# Patient Record
Sex: Male | Born: 1995 | Race: White | Hispanic: No | Marital: Single | Smoking: Never smoker
Health system: Southern US, Community
[De-identification: ages and names within clinical notes are randomized; demographics above are authoritative.]

## PROBLEM LIST (undated history)

## (undated) DIAGNOSIS — F419 Anxiety disorder, unspecified: Secondary | ICD-10-CM

## (undated) HISTORY — DX: Anxiety disorder, unspecified: F41.9

---
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
## 2013-09-20 ENCOUNTER — Encounter (HOSPITAL_COMMUNITY): Payer: Self-pay | Admitting: Emergency Medicine

## 2013-09-20 ENCOUNTER — Emergency Department (HOSPITAL_COMMUNITY): Payer: Worker's Compensation

## 2013-09-20 ENCOUNTER — Emergency Department (HOSPITAL_COMMUNITY)
Admission: EM | Admit: 2013-09-20 | Discharge: 2013-09-20 | Disposition: A | Discharge status: Home / Self Care | Payer: Worker's Compensation | Attending: Emergency Medicine | Admitting: Emergency Medicine

## 2013-09-20 DIAGNOSIS — S060X9A Concussion with loss of consciousness of unspecified duration, initial encounter: Secondary | ICD-10-CM | POA: Insufficient documentation

## 2013-09-20 DIAGNOSIS — Y92410 Unspecified street and highway as the place of occurrence of the external cause: Secondary | ICD-10-CM | POA: Diagnosis not present

## 2013-09-20 DIAGNOSIS — Z792 Long term (current) use of antibiotics: Secondary | ICD-10-CM | POA: Insufficient documentation

## 2013-09-20 DIAGNOSIS — S20219A Contusion of unspecified front wall of thorax, initial encounter: Secondary | ICD-10-CM | POA: Diagnosis not present

## 2013-09-20 DIAGNOSIS — S069X9A Unspecified intracranial injury with loss of consciousness of unspecified duration, initial encounter: Secondary | ICD-10-CM

## 2013-09-20 DIAGNOSIS — Y9389 Activity, other specified: Secondary | ICD-10-CM | POA: Insufficient documentation

## 2013-09-20 MED ORDER — IBUPROFEN 800 MG PO TABS: 800.0000 mg | ORAL_TABLET | Freq: Three times a day (TID) | ORAL | Status: DC | PRN

## 2013-09-20 MED ORDER — HYDROCODONE-ACETAMINOPHEN 5-325 MG PO TABS
1.0000 | ORAL_TABLET | Freq: Once | ORAL | Status: AC
  Administered 2013-09-20: 1 via ORAL
  Filled 2013-09-20: qty 1

## 2013-09-20 MED ORDER — IBUPROFEN 800 MG PO TABS
800.0000 mg | ORAL_TABLET | Freq: Once | ORAL | Status: AC
  Administered 2013-09-20: 800 mg via ORAL
  Filled 2013-09-20: qty 1

## 2013-09-20 MED ORDER — HYDROCODONE-ACETAMINOPHEN 5-325 MG PO TABS: 1.0000 | ORAL_TABLET | Freq: Four times a day (QID) | ORAL | Status: DC | PRN

## 2013-09-20 NOTE — ED Notes (Signed)
Patient transported to CT 
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
## 2013-09-20 NOTE — Discharge Instructions (Signed)
Chest Contusion A contusion is a deep bruise. Bruises happen when an injury causes bleeding under the skin. Signs of bruising include pain, puffiness (swelling), and discolored skin. The bruise may turn blue, purple, or yellow.  HOME CARE  Put ice on the injured area.  Put ice in a plastic bag.  Place a towel between the skin and the bag.  Leave the ice on for 15-20 minutes at a time, 03-04 times a day for the first 48 hours.  Only take medicine as told by your doctor.  Rest.  Take deep breaths (deep-breathing exercises) as told by your doctor.  Stop smoking if you smoke.  Do not lift objects over 5 pounds (2.3 kilograms) for 3 days or longer if told by your doctor. GET HELP RIGHT AWAY IF:   You have more bruising or puffiness.  You have pain that gets worse.  You have trouble breathing.  You are dizzy, weak, or pass out (faint).  You have blood in your pee (urine) or poop (stool).  You cough up or throw up (vomit) blood.  Your puffiness or pain is not helped with medicines. MAKE SURE YOU:   Understand these instructions.  Will watch your condition.  Will get help right away if you are not doing well or get worse. Document Released: 01/15/2008 Document Revised: 04/22/2012 Document Reviewed: 01/20/2012 [facility] Patient Information 2014 [place], [place]. Today, your chest x-ray, head CT are normal, not revealing any fractured ribs, pulmonary contusion, or intracranial bleeding.  You have  been given precautions for minor head injury.  He can safely take the medications prescribed.  If he developed new or worsening symptoms.  Please return immediately for further evaluation
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
## 2013-09-21 NOTE — ED Provider Notes (Signed)
Medical screening examination/treatment/procedure(s) were performed by non-physician practitioner and as supervising physician I was immediately available for consultation/collaboration.  EKG Interpretation   None         Jancarlos Thrun S Jarrah Seher, MD 09/21/13 1313 

## 2014-06-24 ENCOUNTER — Ambulatory Visit (INDEPENDENT_AMBULATORY_CARE_PROVIDER_SITE_OTHER): Payer: BC Managed Care – PPO | Admitting: Physician Assistant

## 2014-06-24 VITALS — BP 120/74 | HR 71 | Temp 98.3°F | Resp 16 | Ht 74.5 in | Wt 166.0 lb

## 2014-06-24 DIAGNOSIS — F419 Anxiety disorder, unspecified: Secondary | ICD-10-CM

## 2014-06-24 MED ORDER — ESCITALOPRAM OXALATE 10 MG PO TABS: 10.0000 mg | ORAL_TABLET | Freq: Every day | ORAL | Status: AC

## 2014-06-24 NOTE — Progress Notes (Signed)
   Subjective:    Patient ID: Johnnette LitterWilliam Vanoverbeke, male    DOB: 04/08/1996, 18 y.o.   MRN: [id number]  HPI Patient presents to start long acting anxiety medication after seeing psychologist, Ulice BoldCarson Sarvis, today 06/24/14. Went to emergency room for anxiety 06/22/14 after not having sx in over 2 years. After returning from break at work, he started feeling like his heart was racing, having trouble breathing, and the space around him was moving fast. Says he had panic attacks when he was in high school, but parents never took him for treatment so he has never taken meds for anxiety. He has an increased stress load now that he is working, paying bills, and going to school for aviation. Unable to see Pleas KochClayton Shugart, PA-C until 07/22/14, so psychologist sent here to start medication in the meantime. ER gave .25 Ativan while there and prescribed 0.5 mg #15pills prn. Denies side effects while on this medication.  Concerned about taking two medications for anxiety and wondering if both are needed.  Review of Systems  Constitutional: Negative for fever, activity change, appetite change, fatigue and unexpected weight change.  Respiratory: Negative for shortness of breath.   Cardiovascular: Negative for chest pain and palpitations.  Neurological: Negative for dizziness, weakness, light-headedness, numbness and headaches.  Psychiatric/Behavioral: Negative for suicidal ideas, behavioral problems, sleep disturbance, self-injury, dysphoric mood, decreased concentration and agitation. The patient is nervous/anxious (not presently). The patient is not hyperactive.        Objective:   Physical Exam  Constitutional: He is oriented to person, place, and time. He appears well-developed and well-nourished. No distress.  Blood pressure 120/74, pulse 71, temperature 98.3 F (36.8 C), resp. rate 16, height 6' 2.5" (1.892 m), weight 166 lb (75.297 kg), SpO2 99 %.   HENT:  Head: Normocephalic and atraumatic.  Right Ear:  External ear normal.  Left Ear: External ear normal.  Eyes: Conjunctivae are normal. Pupils are equal, round, and reactive to light. Right eye exhibits no discharge. Left eye exhibits no discharge. No scleral icterus.  Cardiovascular: Normal rate, regular rhythm and normal heart sounds.  Exam reveals no gallop and no friction rub.   No murmur heard. Pulmonary/Chest: Effort normal and breath sounds normal. He has no wheezes. He has no rales.  Neurological: He is alert and oriented to person, place, and time.  Skin: He is not diaphoretic.  Psychiatric: He has a normal mood and affect. His speech is normal and behavior is normal. Judgment and thought content normal. Cognition and memory are normal.       Assessment & Plan:  1. Anxiety disorder, unspecified anxiety disorder type Had expensive discussion about side effect profile of Lexapro and the need to take medication for 4-6 weeks. Will allow Shugart, PA-C make any adjustments to medication on 07/2014 appt.  - escitalopram (LEXAPRO) 10 MG tablet; Take 1 tablet (10 mg total) by mouth daily.  Dispense: 30 tablet; Refill: 0 - RTC or call if sx worsen or have any of the serious side effects discussed such as SI, HI, depression, sx of serotonin syndrome. - Ativan to be used as prn medication.   Janan Ridgeishira Jayah Balthazar PA-C  [facility] 06/24/2014 6:39 PM

## 2014-06-24 NOTE — Patient Instructions (Signed)
Generalized Anxiety Disorder Generalized anxiety disorder (GAD) is a mental disorder. It interferes with life functions, including relationships, work, and school. GAD is different from normal anxiety, which everyone experiences at some point in their lives in response to specific life events and activities. Normal anxiety actually helps us prepare for and get through these life events and activities. Normal anxiety goes away after the event or activity is over.  GAD causes anxiety that is not necessarily related to specific events or activities. It also causes excess anxiety in proportion to specific events or activities. The anxiety associated with GAD is also difficult to control. GAD can vary from mild to severe. People with severe GAD can have intense waves of anxiety with physical symptoms (panic attacks).  SYMPTOMS The anxiety and worry associated with GAD are difficult to control. This anxiety and worry are related to many life events and activities and also occur more days than not for 6 months or longer. People with GAD also have three or more of the following symptoms (one or more in children):  Restlessness.   Fatigue.  Difficulty concentrating.   Irritability.  Muscle tension.  Difficulty sleeping or unsatisfying sleep. DIAGNOSIS GAD is diagnosed through an assessment by your health care provider. Your health care provider will ask you questions aboutyour mood,physical symptoms, and events in your life. Your health care provider may ask you about your medical history and use of alcohol or drugs, including prescription medicines. Your health care provider may also do a physical exam and blood tests. Certain medical conditions and the use of certain substances can cause symptoms similar to those associated with GAD. Your health care provider may refer you to a mental health specialist for further evaluation. TREATMENT The following therapies are usually used to treat GAD:    Medication. Antidepressant medication usually is prescribed for long-term daily control. Antianxiety medicines may be added in severe cases, especially when panic attacks occur.   Talk therapy (psychotherapy). Certain types of talk therapy can be helpful in treating GAD by providing support, education, and guidance. A form of talk therapy called cognitive behavioral therapy can teach you healthy ways to think about and react to daily life events and activities.  Stress managementtechniques. These include yoga, meditation, and exercise and can be very helpful when they are practiced regularly. A mental health specialist can help determine which treatment is best for you. Some people see improvement with one therapy. However, other people require a combination of therapies. Document Released: 11/23/2012 Document Revised: 12/13/2013 Document Reviewed: 11/23/2012 [employer] Patient Information 2015 [employer]. This information is not intended to replace advice given to you by your health care provider. Make sure you discuss any questions you have with your health care provider.  

## 2014-06-25 NOTE — Progress Notes (Signed)
I have discussed this case with Ms. Brewington, PA-C and agree.  
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
